# Patient Record
Sex: Female | Born: 1990 | Race: Black or African American | Hispanic: No | Marital: Single | Smoking: Never smoker
Health system: Southern US, Community
[De-identification: ages and names within clinical notes are randomized; demographics above are authoritative.]

## PROBLEM LIST (undated history)

## (undated) HISTORY — PX: CHOLECYSTECTOMY: SHX55

---
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
## 2008-01-09 ENCOUNTER — Ambulatory Visit: Payer: Self-pay | Admitting: General Surgery

## 2008-05-05 ENCOUNTER — Emergency Department: Payer: Self-pay | Admitting: Internal Medicine

## 2008-05-17 ENCOUNTER — Emergency Department: Payer: Self-pay | Admitting: Emergency Medicine

## 2008-11-18 ENCOUNTER — Ambulatory Visit: Payer: Self-pay | Admitting: Family Medicine

## 2009-04-06 ENCOUNTER — Emergency Department: Payer: Self-pay | Admitting: Emergency Medicine

## 2009-07-25 ENCOUNTER — Emergency Department: Payer: Self-pay | Admitting: Emergency Medicine

## 2010-08-18 ENCOUNTER — Emergency Department: Payer: Self-pay | Admitting: Emergency Medicine

## 2010-11-07 ENCOUNTER — Ambulatory Visit: Payer: Self-pay | Admitting: Family Medicine

## 2011-04-17 DIAGNOSIS — G43909 Migraine, unspecified, not intractable, without status migrainosus: Secondary | ICD-10-CM | POA: Insufficient documentation

## 2013-05-03 ENCOUNTER — Ambulatory Visit: Payer: Self-pay | Admitting: Family Medicine

## 2013-05-03 LAB — RAPID INFLUENZA A&B ANTIGENS

## 2013-05-03 LAB — URINALYSIS, COMPLETE
Bacteria: NEGATIVE
Glucose,UR: NEGATIVE mg/dL (ref 0–75)
Nitrite: NEGATIVE
Specific Gravity: 1.02 (ref 1.003–1.030)

## 2013-05-03 LAB — CBC WITH DIFFERENTIAL/PLATELET
Basophil #: 0.1 10*3/uL (ref 0.0–0.1)
Basophil %: 1 %
Eosinophil #: 0 10*3/uL (ref 0.0–0.7)
Eosinophil %: 0.2 %
Lymphocyte #: 1 10*3/uL (ref 1.0–3.6)
Lymphocyte %: 11.8 %
MCH: 29.6 pg (ref 26.0–34.0)
MCV: 89 fL (ref 80–100)
Monocyte #: 0.4 x10 3/mm (ref 0.2–0.9)
Neutrophil #: 6.7 10*3/uL — ABNORMAL HIGH (ref 1.4–6.5)
Neutrophil %: 82.6 %
RBC: 4.46 10*6/uL (ref 3.80–5.20)
RDW: 13.2 % (ref 11.5–14.5)

## 2013-05-05 LAB — URINE CULTURE

## 2014-01-17 ENCOUNTER — Emergency Department: Payer: Self-pay | Admitting: Emergency Medicine

## 2014-01-20 LAB — BETA STREP CULTURE(ARMC)

## 2016-04-26 DIAGNOSIS — I10 Essential (primary) hypertension: Secondary | ICD-10-CM | POA: Insufficient documentation

## 2016-08-16 ENCOUNTER — Emergency Department
Admission: EM | Admit: 2016-08-16 | Discharge: 2016-08-16 | Disposition: A | Discharge status: Home / Self Care | Payer: Self-pay | Attending: Emergency Medicine | Admitting: Emergency Medicine

## 2016-08-16 ENCOUNTER — Emergency Department: Payer: Self-pay

## 2016-08-16 ENCOUNTER — Encounter: Payer: Self-pay | Admitting: Emergency Medicine

## 2016-08-16 DIAGNOSIS — R42 Dizziness and giddiness: Secondary | ICD-10-CM | POA: Insufficient documentation

## 2016-08-16 LAB — BASIC METABOLIC PANEL
Anion gap: 9 (ref 5–15)
BUN: 10 mg/dL (ref 6–20)
CALCIUM: 9.2 mg/dL (ref 8.9–10.3)
CHLORIDE: 105 mmol/L (ref 101–111)
CO2: 23 mmol/L (ref 22–32)
CREATININE: 0.66 mg/dL (ref 0.44–1.00)
GFR calc Af Amer: >60 mL/min (ref >60)
GFR calc non Af Amer: >60 mL/min (ref >60)
GLUCOSE: 93 mg/dL (ref 65–99)
Potassium: 3.7 mmol/L (ref 3.5–5.1)
Sodium: 137 mmol/L (ref 135–145)

## 2016-08-16 LAB — CBC
HCT: 39.9 % (ref 35.0–47.0)
Hemoglobin: 13.6 g/dL (ref 12.0–16.0)
MCH: 30.3 pg (ref 26.0–34.0)
MCHC: 34.1 g/dL (ref 32.0–36.0)
MCV: 88.7 fL (ref 80.0–100.0)
PLATELETS: 172 10*3/uL (ref 150–440)
RBC: 4.5 MIL/uL (ref 3.80–5.20)
RDW: 13.5 % (ref 11.5–14.5)
WBC: 7 10*3/uL (ref 3.6–11.0)

## 2016-08-16 LAB — URINALYSIS, COMPLETE (UACMP) WITH MICROSCOPIC
Bacteria, UA: NONE SEEN
Bilirubin Urine: NEGATIVE
GLUCOSE, UA: NEGATIVE mg/dL
Hgb urine dipstick: NEGATIVE
KETONES UR: 80 mg/dL — AB
Nitrite: NEGATIVE
PH: 6 (ref 5.0–8.0)
PROTEIN: NEGATIVE mg/dL
Specific Gravity, Urine: 1.016 (ref 1.005–1.030)

## 2016-08-16 LAB — POCT PREGNANCY, URINE: Preg Test, Ur: NEGATIVE

## 2016-08-16 MED ORDER — SODIUM CHLORIDE 0.9 % IV SOLN
Freq: Once | INTRAVENOUS | Status: AC
  Administered 2016-08-16: 19:00:00 via INTRAVENOUS

## 2016-08-16 MED ORDER — ONDANSETRON HCL 4 MG/2ML IJ SOLN
4.0000 mg | Freq: Once | INTRAMUSCULAR | Status: AC
  Administered 2016-08-16: 4 mg via INTRAVENOUS

## 2016-08-16 MED ORDER — PROMETHAZINE HCL 25 MG PO TABS: 25.0000 mg | ORAL_TABLET | Freq: Four times a day (QID) | ORAL | 0 refills | Status: AC | PRN

## 2016-08-16 MED ORDER — DIAZEPAM 5 MG PO TABS
5.0000 mg | ORAL_TABLET | Freq: Once | ORAL | Status: AC
  Administered 2016-08-16: 5 mg via ORAL

## 2016-08-16 MED ORDER — ONDANSETRON HCL 4 MG/2ML IJ SOLN
INTRAMUSCULAR | Status: AC
  Administered 2016-08-16: 4 mg via INTRAVENOUS
  Filled 2016-08-16: qty 2

## 2016-08-16 MED ORDER — MECLIZINE HCL 25 MG PO TABS
ORAL_TABLET | ORAL | Status: AC
  Administered 2016-08-16: 50 mg via ORAL
  Filled 2016-08-16: qty 2

## 2016-08-16 MED ORDER — MECLIZINE HCL 25 MG PO TABS
50.0000 mg | ORAL_TABLET | Freq: Once | ORAL | Status: AC
  Administered 2016-08-16: 50 mg via ORAL

## 2016-08-16 MED ORDER — PROMETHAZINE HCL 25 MG/ML IJ SOLN
25.0000 mg | Freq: Once | INTRAMUSCULAR | Status: AC
  Administered 2016-08-16: 25 mg via INTRAMUSCULAR
  Filled 2016-08-16 (×2): qty 1

## 2016-08-16 MED ORDER — MECLIZINE HCL 25 MG PO TABS: 25.0000 mg | ORAL_TABLET | Freq: Three times a day (TID) | ORAL | 1 refills | Status: AC | PRN

## 2016-08-16 MED ORDER — DIAZEPAM 5 MG PO TABS
ORAL_TABLET | ORAL | Status: AC
  Administered 2016-08-16: 5 mg via ORAL
  Filled 2016-08-16: qty 1

## 2016-08-16 NOTE — ED Triage Notes (Signed)
Pt c/o dizziness and light headed-ness. Pt states [facility] and was told she was dehydrated, given Zofran, and told to increase her PO intake. Pt states she has not been able to keep fluids down, but states she has not been taking her Zofran as prescribed. Pt is alert and oriented at this time.

## 2016-08-16 NOTE — ED Provider Notes (Signed)
[facility] Emergency Department Provider Note        Time seen: ----------------------------------------- 6:10 PM on 08/16/2016 -----------------------------------------    I have reviewed the triage vital signs and the nursing notes.   HISTORY  Chief Complaint Dizziness    HPI Kristi Fritz is a 25 y.o. female who presents to ER for dizziness and lightheadedness. Patient describes room spinning sensation whenever she stands up. She [facility] and was told she was dehydrated, given Zofran and told to increase her oral intake. She has not been able to keep anything down despite taking Zofran. She had some diarrhea as well yesterday, but denies any today.   History reviewed. No pertinent past medical history.  There are no active problems to display for this patient.   Past Surgical History:  Procedure Laterality Date  . CHOLECYSTECTOMY      Allergies Patient has no known allergies.  Social History Social History  Substance Use Topics  . Smoking status: Never Smoker  . Smokeless tobacco: Never Used  . Alcohol use Yes     Comment: Occ    Review of Systems Constitutional: Negative for fever. Cardiovascular: Negative for chest pain. Respiratory: Negative for shortness of breath. Gastrointestinal: Negative for abdominal pain, Positive for recent vomiting and diarrhea Genitourinary: Negative for dysuria. Musculoskeletal: Negative for back pain. Skin: Negative for rash. Neurological: Negative for headaches, focal weakness or numbness. Positive for room spinning sensation  10-point ROS otherwise negative.  ____________________________________________   PHYSICAL EXAM:  VITAL SIGNS: ED Triage Vitals  Enc Vitals Group     BP 08/16/16 1659 128/81     Pulse Rate 08/16/16 1659 81     Resp 08/16/16 1659 18     Temp 08/16/16 1659 98.1 F (36.7 C)     Temp Source 08/16/16 1659 Oral     SpO2 08/16/16 1659 100 %    Weight 08/16/16 1654 211 lb (95.7 kg)     Height 08/16/16 1654 5\' 5"  (1.651 m)     Head Circumference --      Peak Flow --      Pain Score 08/16/16 1654 0     Pain Loc --      Pain Edu? --      Excl. in GC? --     Constitutional: Alert and oriented. Well appearing and in no distress. Eyes: Conjunctivae are normal. PERRL. Normal extraocular movements. ENT   Head: Normocephalic and atraumatic.   Nose: No congestion/rhinnorhea.   Mouth/Throat: Mucous membranes are moist.   Neck: No stridor. Cardiovascular: Normal rate, regular rhythm. No murmurs, rubs, or gallops. Respiratory: Normal respiratory effort without tachypnea nor retractions. Breath sounds are clear and equal bilaterally. No wheezes/rales/rhonchi. Gastrointestinal: Soft and nontender. Normal bowel sounds Musculoskeletal: Nontender with normal range of motion in all extremities. No lower extremity tenderness nor edema. Neurologic:  Normal speech and language. No gross focal neurologic deficits are appreciated.  Skin:  Skin is warm, dry and intact. No rash noted. Psychiatric: Mood and affect are normal. Speech and behavior are normal.  ____________________________________________  EKG: Interpreted by me. Sinus rhythm rate of 72 bpm, normal PR interval, normal QRS, normal QT interval. Normal axis.  ____________________________________________  ED COURSE:  Pertinent labs & imaging results that were available during my care of the patient were reviewed by me and considered in my medical decision making (see chart for details). Clinical Course   She is in no distress, we will assess with  labs and possibly imaging.  Procedures ____________________________________________   LABS (pertinent positives/negatives)  Labs Reviewed  URINALYSIS, COMPLETE (UACMP) WITH MICROSCOPIC - Abnormal; Notable for the following:       Result Value   Color, Urine YELLOW (*)    APPearance CLEAR (*)    Ketones, ur 80 (*)     Leukocytes, UA SMALL (*)    Squamous Epithelial / LPF 0-5 (*)    All other components within normal limits  BASIC METABOLIC PANEL  CBC  POC URINE PREG, ED  POCT PREGNANCY, URINE  CT head is unremarkable ____________________________________________  FINAL ASSESSMENT AND PLAN  Dizziness  Plan: Patient with labs and imaging as dictated above. Patient is in no distress, symptoms resemble dizziness more than any other etiology. This could be affected by gastroenteritis. She'll be discharged with meclizine and Phenergan and encouraged to have outpatient follow-up.   Emily FilbertWilliams, Jesus Nevills E, MD   Note: This dictation was prepared with Dragon dictation. Any transcriptional errors that result from this process are unintentional    Emily FilbertJonathan E Ileah Falkenstein, MD 08/16/16 2039

## 2017-04-02 ENCOUNTER — Ambulatory Visit (INDEPENDENT_AMBULATORY_CARE_PROVIDER_SITE_OTHER): Payer: Medicaid Other | Admitting: Advanced Practice Midwife

## 2017-04-02 ENCOUNTER — Encounter: Payer: Self-pay | Admitting: Advanced Practice Midwife

## 2017-04-02 VITALS — BP 130/96 | HR 103 | Ht 65.0 in | Wt 231.0 lb

## 2017-04-02 DIAGNOSIS — Z3046 Encounter for surveillance of implantable subdermal contraceptive: Secondary | ICD-10-CM

## 2017-04-02 DIAGNOSIS — Z3049 Encounter for surveillance of other contraceptives: Secondary | ICD-10-CM | POA: Diagnosis not present

## 2017-04-02 NOTE — Progress Notes (Signed)
     GYNECOLOGY PROCEDURE NOTE The patient is here today for removal of her Nexplanon that she had placed 1 year ago. She has had irregular bleeding since insertion and prefers to have the device removed.  Nexplanon removal discussed in detail.  Risks of infection, bleeding, nerve injury all reviewed.  Patient understands risks and desires to proceed.  Verbal consent obtained.  Patient is certain she wants the implanon removed.  All questions answered.  Procedure: Patient placed in dorsal supine with left arm above head, elbow flexed at 90 degrees, arm resting on examination table.  Nexplanon identified without problems.  Betadine scrub x2.  1 ml of 1% lidocaine injected under implanon device without problems.  Sterile gloves applied.  Small 0.5cm incision made at distal tip of Nexplanon device with 11 blade scalpel.  Nexplanon brought to incision and grasped with a small kelly clamp.  Nexplanon removed intact without problems.  Pressure applied to incision.  Hemostasis obtained.  Steri-strips applied, followed by bandage and compression dressing.  Patient tolerated procedure well.  No complications.   Assessment: 26 y.o. year old female now s/p uncomplicated Nexplanon removal.  Plan: 1.  Patient given post procedure precautions and asked to call for fever, chills, redness or drainage from her incision, bleeding from incision.  She understands she will likely have a small bruise near site of removal and can remove bandage tomorrow and steri-strips in approximately 1 week.  2) Contraception: patient plans condoms  Tresea MallJane Gavon Majano, CNM

## 2018-11-26 IMAGING — CT CT HEAD W/O CM
3 series · 15 of 47 positions shown, 18 images · non-contrast
Comparison: None.

CLINICAL DATA: Dizziness and lightheadedness.

EXAM:
CT HEAD WITHOUT CONTRAST
TECHNIQUE: Contiguous axial images were obtained from the base of the skull
through the vertex without intravenous contrast.

[Series 2: head wo · axial · 0.44mm/px · z∈[-126,-1]mm · 9 of 31 slices shown, 12 images]
[im 3/31  brain]
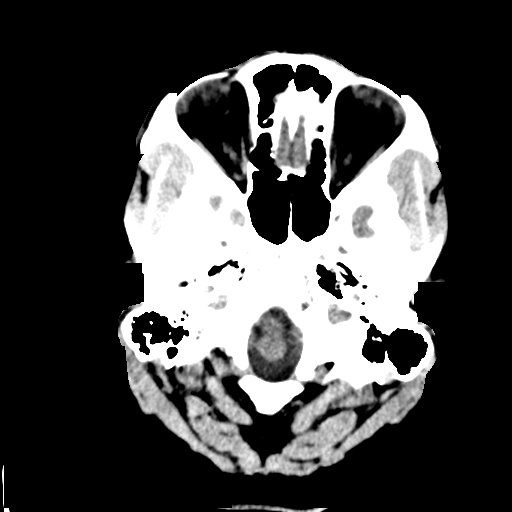
[im 3/31  bone]
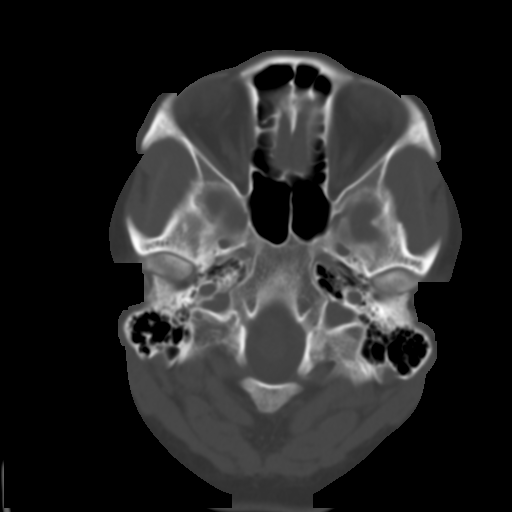
[im 6/31  brain]
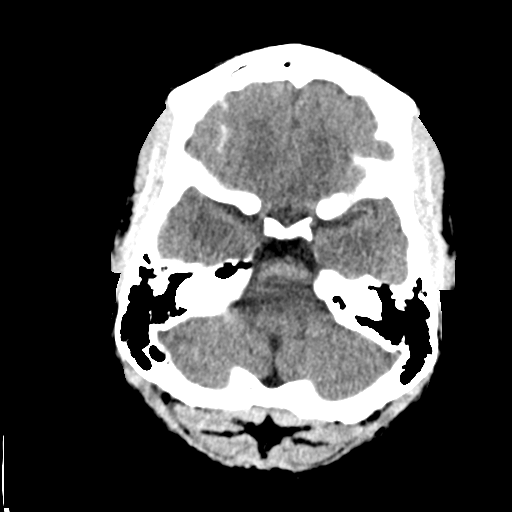
[im 9/31  brain]
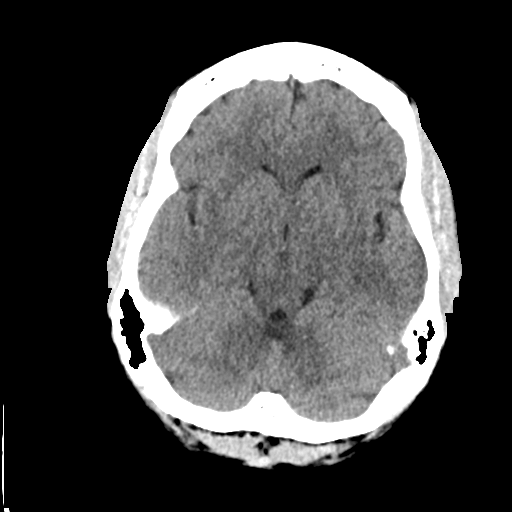
[im 12/31  brain]
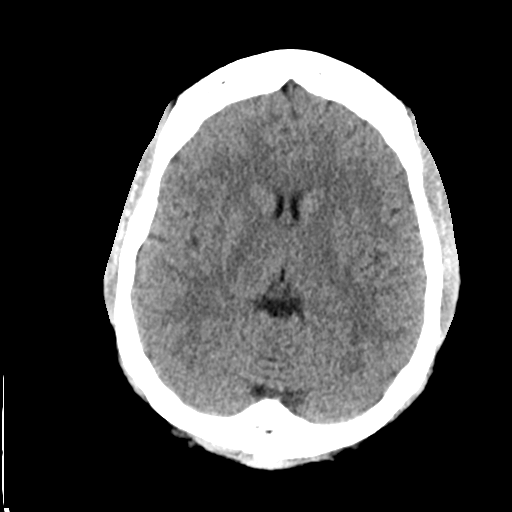
[im 16/31  brain]
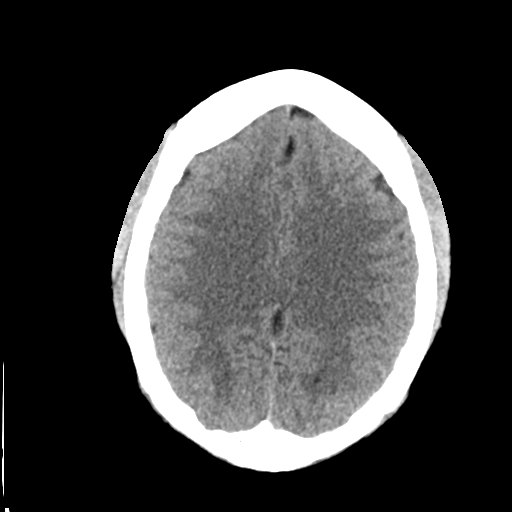
[im 16/31  bone]
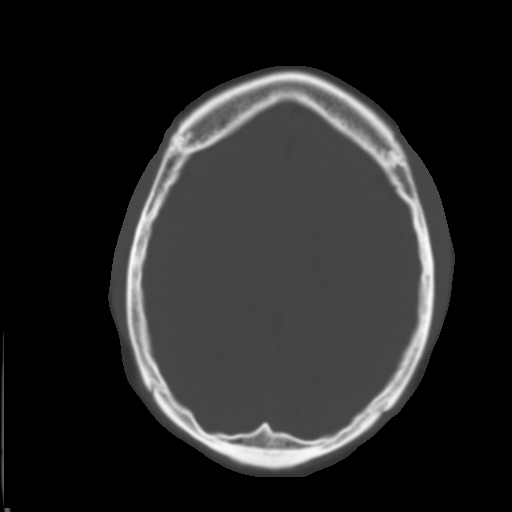
[im 19/31  brain]
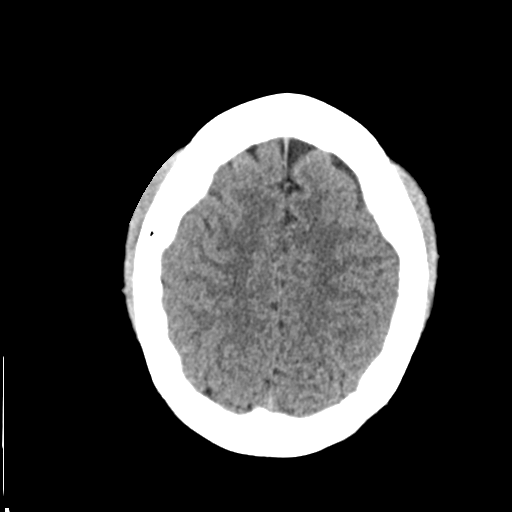
[im 22/31  brain]
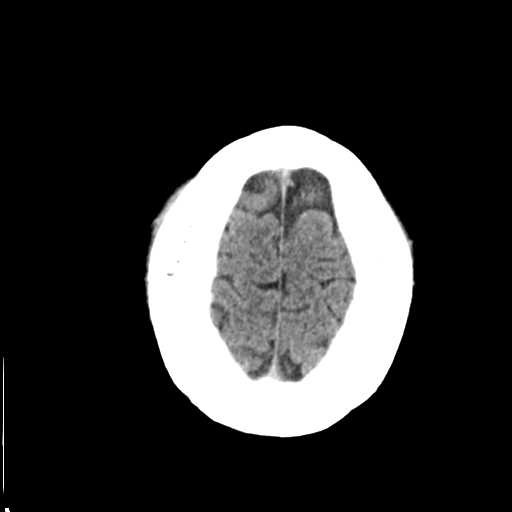
[im 25/31  brain]
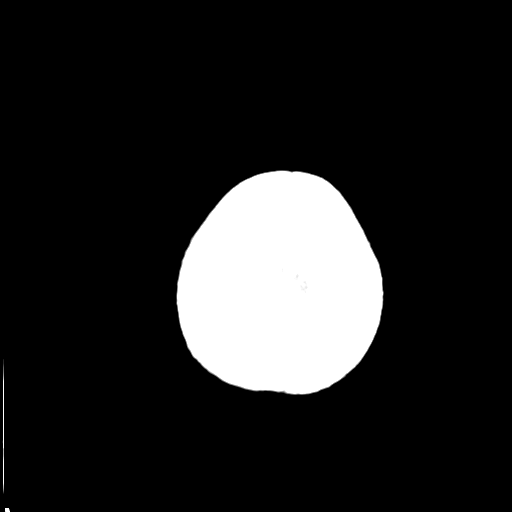
[im 28/31  brain]
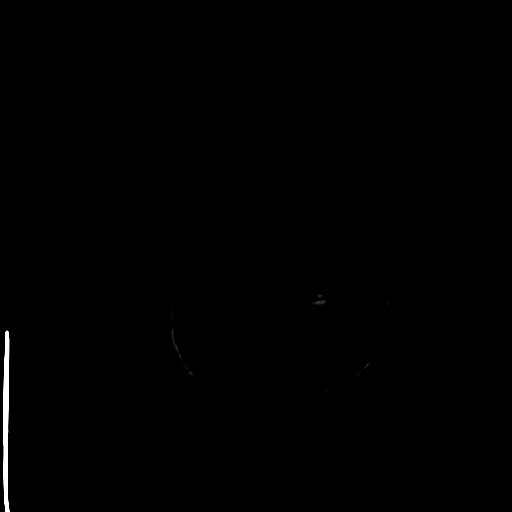
[im 28/31  bone]
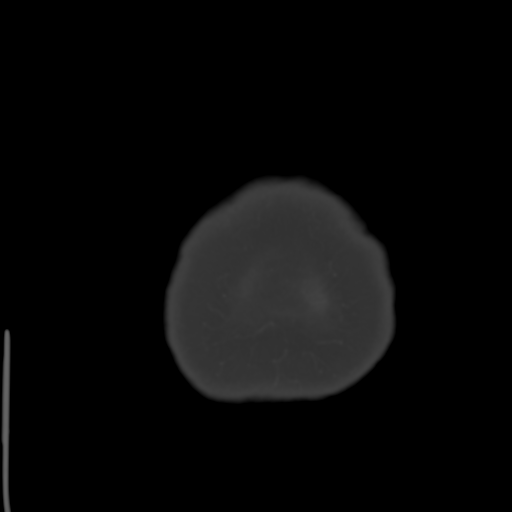

[Series 4: coronal soft tissue · coronal · 0.30mm/px · 3 of 63 slices shown]
[im 21/63  brain]
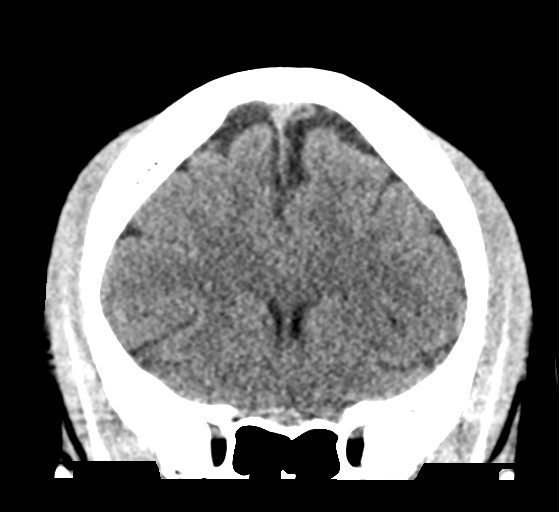
[im 28/63  brain]
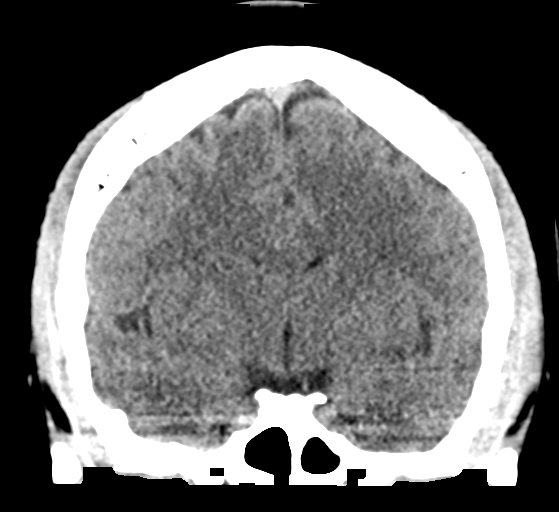
[im 35/63  brain]
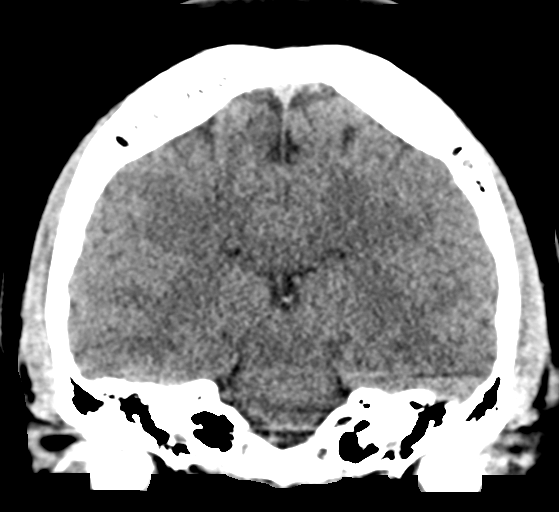

[Series 5: sagittal soft tissue · sagittal · 0.29mm/px · 3 of 56 slices shown]
[im 19/56  brain]
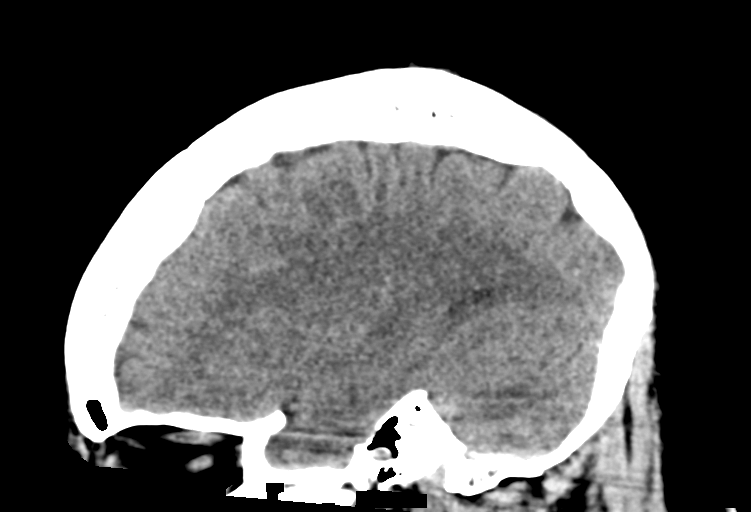
[im 28/56  brain]
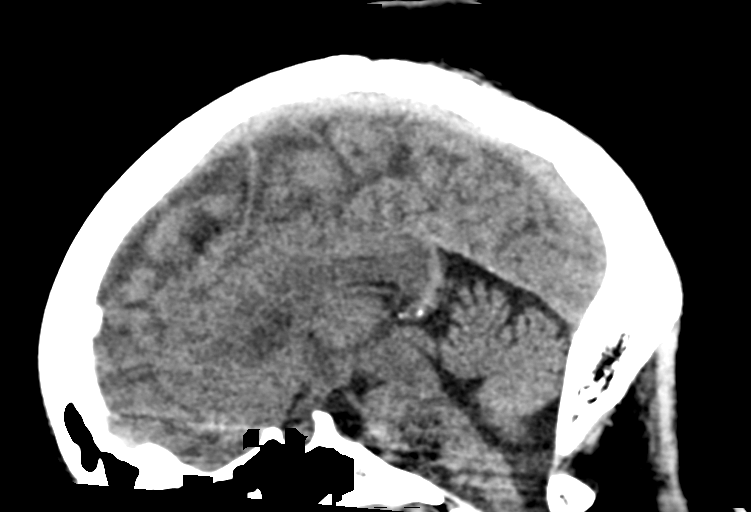
[im 37/56  brain]
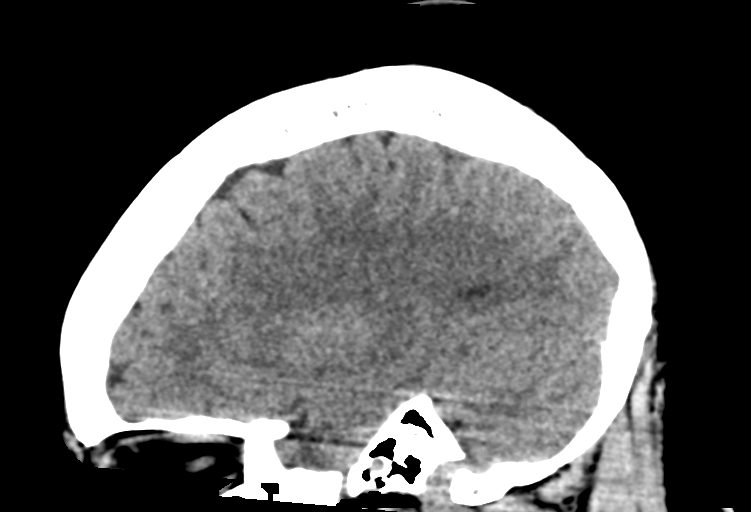

[15 of 47 positions shown; findings below may reference images not displayed]

FINDINGS: Brain: No evidence of acute infarction, hemorrhage, hydrocephalus,
extra-axial collection or mass lesion/mass effect.

Vascular: No hyperdense vessel or unexpected calcification.

Skull: Normal. Negative for fracture or focal lesion.

Sinuses/Orbits: Paranasal sinuses and mastoid air cells are clear.
The visualized orbits are unremarkable.

Other: None.
IMPRESSION: Unremarkable noncontrast head CT.
